# Patient Record
Sex: Male | Born: 2000 | Race: Black or African American | Hispanic: No | Marital: Single | Smoking: Never smoker
Health system: Southern US, Community
[De-identification: ages and names within clinical notes are randomized; demographics above are authoritative.]

## PROBLEM LIST (undated history)

## (undated) DIAGNOSIS — Z87898 Personal history of other specified conditions: Secondary | ICD-10-CM

## (undated) DIAGNOSIS — S83529A Sprain of posterior cruciate ligament of unspecified knee, initial encounter: Secondary | ICD-10-CM

---
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
## 2021-01-20 DIAGNOSIS — Z00129 Encounter for routine child health examination without abnormal findings: Secondary | ICD-10-CM | POA: Insufficient documentation

## 2021-01-20 DIAGNOSIS — Z0000 Encounter for general adult medical examination without abnormal findings: Secondary | ICD-10-CM | POA: Diagnosis not present

## 2021-10-18 ENCOUNTER — Ambulatory Visit (HOSPITAL_COMMUNITY)
Admission: EM | Admit: 2021-10-18 | Discharge: 2021-10-18 | Disposition: A | Discharge status: Home / Self Care | Payer: Medicaid Other | Attending: Nurse Practitioner | Admitting: Nurse Practitioner

## 2021-10-18 ENCOUNTER — Other Ambulatory Visit: Payer: Self-pay

## 2021-10-18 ENCOUNTER — Encounter (HOSPITAL_COMMUNITY): Payer: Self-pay

## 2021-10-18 DIAGNOSIS — J101 Influenza due to other identified influenza virus with other respiratory manifestations: Secondary | ICD-10-CM | POA: Diagnosis not present

## 2021-10-18 DIAGNOSIS — J111 Influenza due to unidentified influenza virus with other respiratory manifestations: Secondary | ICD-10-CM

## 2021-10-18 DIAGNOSIS — R112 Nausea with vomiting, unspecified: Secondary | ICD-10-CM

## 2021-10-18 LAB — POC INFLUENZA A AND B ANTIGEN (URGENT CARE ONLY)
INFLUENZA A ANTIGEN, POC: POSITIVE — AB
INFLUENZA B ANTIGEN, POC: NEGATIVE

## 2021-10-18 MED ORDER — ONDANSETRON 8 MG PO TBDP: 8.0000 mg | ORAL_TABLET | Freq: Three times a day (TID) | ORAL | 0 refills | Status: DC | PRN

## 2021-10-18 MED ORDER — OSELTAMIVIR PHOSPHATE 75 MG PO CAPS: 75.0000 mg | ORAL_CAPSULE | Freq: Two times a day (BID) | ORAL | 0 refills | Status: DC

## 2021-10-18 NOTE — ED Triage Notes (Signed)
Pt presents with cough, vomiting, diarrhea, and generalized body aches X 3 days.

## 2021-10-18 NOTE — ED Provider Notes (Signed)
[facility]    CSN: [id number] Arrival date & time: 10/18/21  1532      History   Chief Complaint Chief Complaint  Patient presents with   Influenza    HPI Griffon Herberg is a 20 y.o. male.   Subjective:   Thong Feeny is a 20 y.o. male who presents for evaluation of influenza like symptoms. Symptoms include headache, myalgias, nausea, vomiting, diarrhea, runny nose, congestion, sore throat (d/t cough) and right ear pain (mild). Symptoms have been present for 3 days. He has tried to alleviate the symptoms with rest, dayquil and alka seltzer with minimal relief. He is eating and drinking normally. He denies any sick contacts. He has not had the flu or COVID vaccine. High risk factors for influenza complications: none.  The following portions of the patient's history were reviewed and updated as appropriate: allergies, current medications, past family history, past medical history, past social history, past surgical history, and problem list.     Today's evaluation has revealed no signs of a dangerous process. Discussed diagnosis with patient and/or guardian. Patient and/or guardian aware of their diagnosis, possible red flag symptoms to watch out for and need for close follow up. Patient and/or guardian understands verbal and written discharge instructions. Patient and/or guardian comfortable with plan and disposition.  Patient and/or guardian has a clear mental status at this time, good insight into illness (after discussion and teaching) and has clear judgment to make decisions regarding their care  This care was provided during an unprecedented National Emergency due to the Novel Coronavirus (COVID-19) pandemic. COVID-19 infections and transmission risks place heavy strains on healthcare resources.  As this pandemic evolves, our facility, providers, and staff strive to respond fluidly, to remain operational, and to provide care relative to available resources and  information. Outcomes are unpredictable and treatments are without well-defined guidelines. Further, the impact of COVID-19 on all aspects of urgent care, including the impact to patients seeking care for reasons other than COVID-19, is unavoidable during this national emergency. At this time of the global pandemic, management of patients has significantly changed, even for non-COVID positive patients given high local and regional COVID volumes at this time requiring high healthcare system and resource utilization. The standard of care for management of both COVID suspected and non-COVID suspected patients continues to change rapidly at the local, regional, national, and global levels. This patient was worked up and treated to the best available but ever changing evidence and resources available at this current time.   Documentation was completed with the aid of voice recognition software. Transcription may contain typographical errors. Past Medical History:  Diagnosis Date   History of febrile seizure    age 7   Tear of posterior cruciate ligament of knee 01/2015   left    Patient Active Problem List   Diagnosis Date Noted   Difficulty swallowing    Anxiety disorder     Past Surgical History:  Procedure Laterality Date   KNEE ARTHROSCOPY WITH EXCISION PLICA Left 02/01/2015   Procedure: KNEE ARTHROSCOPY WITH EXCISION PLICA  WITH DEBRIDEMENT;  Surgeon: Sheral Apley, MD;  Location: [facility];  Service: Orthopedics;  Laterality: Left;       Home Medications    Prior to Admission medications   Medication Sig Start Date End Date Taking? Authorizing Provider  ondansetron (ZOFRAN-ODT) 8 MG disintegrating tablet Take 1 tablet (8 mg total) by mouth every 8 (eight) hours as needed for nausea or vomiting. 10/18/21  Yes Enrique Sack, FNP  oseltamivir (TAMIFLU) 75 MG capsule Take 1 capsule (75 mg total) by mouth every 12 (twelve) hours. 10/18/21  Yes Enrique Sack, FNP   ibuprofen (ADVIL,MOTRIN) 200 MG tablet Take 200 mg by mouth every 6 (six) hours as needed for moderate pain.    [provider]    Family History History reviewed. No pertinent family history.  Social History Social History   Tobacco Use   Smoking status: Never   Smokeless tobacco: Never  Substance Use Topics   Alcohol use: No   Drug use: No     Allergies   Patient has no known allergies.   Review of Systems Review of Systems   Physical Exam Triage Vital Signs ED Triage Vitals  Enc Vitals Group     BP 10/18/21 1815 (!) 146/56     Pulse Rate 10/18/21 1815 67     Resp 10/18/21 1815 18     Temp 10/18/21 1815 98 F (36.7 C)     Temp Source 10/18/21 1815 Oral     SpO2 10/18/21 1815 94 %     Weight --      Height --      Head Circumference --      Peak Flow --      Pain Score 10/18/21 1813 5     Pain Loc --      Pain Edu? --      Excl. in [place]? --    No data found.  Updated Vital Signs BP (!) 146/56 (BP Location: Right Arm)   Pulse 67   Temp 98 F (36.7 C) (Oral)   Resp 18   SpO2 94%   Visual Acuity Right Eye Distance:   Left Eye Distance:   Bilateral Distance:    Right Eye Near:   Left Eye Near:    Bilateral Near:     Physical Exam   UC Treatments / Results  Labs (all labs ordered are listed, but only abnormal results are displayed) Labs Reviewed  POC INFLUENZA A AND B ANTIGEN (URGENT CARE ONLY) - Abnormal; Notable for the following components:      Result Value   INFLUENZA A ANTIGEN, POC POSITIVE (*)    All other components within normal limits    EKG   Radiology No results found.  Procedures Procedures (including critical care time)  Medications Ordered in UC Medications - No data to display  Initial Impression / Assessment and Plan / UC Course  I have reviewed the triage vital signs and the nursing notes.  Pertinent labs & imaging results that were available during my care of the patient were reviewed by me and  considered in my medical decision making (see chart for details).     20 yo male presenting with a three-day history of eadache, myalgias, nausea, vomiting, diarrhea, runny nose, congestion, sore throat and right ear pain. Patient is afebrile and nontoxic appearing. Flu A positive.   Plan:  Supportive care with appropriate antipyretics and fluids. Antivirals per orders.  Today's evaluation has revealed no signs of a dangerous process. Discussed diagnosis with patient and/or guardian. Patient and/or guardian aware of their diagnosis, possible red flag symptoms to watch out for and need for close follow up. Patient and/or guardian understands verbal and written discharge instructions. Patient and/or guardian comfortable with plan and disposition.  Patient and/or guardian has a clear mental status at this time, good insight into illness (after discussion and teaching) and has clear judgment to make  decisions regarding their care  This care was provided during an unprecedented National Emergency due to the Novel Coronavirus (COVID-19) pandemic. COVID-19 infections and transmission risks place heavy strains on healthcare resources.  As this pandemic evolves, our facility, providers, and staff strive to respond fluidly, to remain operational, and to provide care relative to available resources and information. Outcomes are unpredictable and treatments are without well-defined guidelines. Further, the impact of COVID-19 on all aspects of urgent care, including the impact to patients seeking care for reasons other than COVID-19, is unavoidable during this national emergency. At this time of the global pandemic, management of patients has significantly changed, even for non-COVID positive patients given high local and regional COVID volumes at this time requiring high healthcare system and resource utilization. The standard of care for management of both COVID suspected and non-COVID suspected patients continues  to change rapidly at the local, regional, national, and global levels. This patient was worked up and treated to the best available but ever changing evidence and resources available at this current time.   Documentation was completed with the aid of voice recognition software. Transcription may contain typographical errors. Final Clinical Impressions(s) / UC Diagnoses   Final diagnoses:  Influenza  Nausea and vomiting, unspecified vomiting type     Discharge Instructions      You have influenza A. Influenza (flu) is a viral infection that mainly affects the respiratory tract. This includes the lungs, nose, and throat. The flu spreads easily from person to person. Antibiotic medicines are not prescribed for viral infections.This is because antibiotics are designed to kill bacteria. They do not kill viruses. Symptoms of the flu usually begin suddenly and can last 4-14 days. Take medications as prescribed. Drink plenty of fluids and get lots of rest. Do not leave home until you do not have a fever for 24 hours without taking fever reducing medicines like tylenol or ibuprofen.   Go to the ED immediately if you get worse or have any other symptoms.     Feel better soon!   Aldona Bar, FNP-C      ED Prescriptions     Medication Sig Dispense Auth. Provider   oseltamivir (TAMIFLU) 75 MG capsule Take 1 capsule (75 mg total) by mouth every 12 (twelve) hours. 10 capsule Enrique Sack, FNP   ondansetron (ZOFRAN-ODT) 8 MG disintegrating tablet Take 1 tablet (8 mg total) by mouth every 8 (eight) hours as needed for nausea or vomiting. 20 tablet Enrique Sack, FNP      PDMP not reviewed this encounter.   Enrique Sack, [place] 10/18/21 [phone]

## 2021-10-18 NOTE — Discharge Instructions (Addendum)
You have influenza A. Influenza (flu) is a viral infection that mainly affects the respiratory tract. This includes the lungs, nose, and throat. The flu spreads easily from person to person. Antibiotic medicines are not prescribed for viral infections.This is because antibiotics are designed to kill bacteria. They do not kill viruses. Symptoms of the flu usually begin suddenly and can last 4-14 days. Take medications as prescribed. Drink plenty of fluids and get lots of rest. Do not leave home until you do not have a fever for 24 hours without taking fever reducing medicines like tylenol or ibuprofen.   Go to the ED immediately if you get worse or have any other symptoms.     Feel better soon!   Lelon Mast, FNP-C

## 2021-12-12 ENCOUNTER — Ambulatory Visit (HOSPITAL_COMMUNITY)
Admission: EM | Admit: 2021-12-12 | Discharge: 2021-12-12 | Disposition: A | Discharge status: Home / Self Care | Payer: Medicaid Other | Attending: Family Medicine | Admitting: Family Medicine

## 2021-12-12 ENCOUNTER — Other Ambulatory Visit: Payer: Self-pay

## 2021-12-12 ENCOUNTER — Encounter (HOSPITAL_COMMUNITY): Payer: Self-pay | Admitting: Emergency Medicine

## 2021-12-12 DIAGNOSIS — R1013 Epigastric pain: Secondary | ICD-10-CM

## 2021-12-12 DIAGNOSIS — K625 Hemorrhage of anus and rectum: Secondary | ICD-10-CM | POA: Diagnosis not present

## 2021-12-12 MED ORDER — ONDANSETRON 4 MG PO TBDP: 4.0000 mg | ORAL_TABLET | Freq: Three times a day (TID) | ORAL | 0 refills | Status: DC | PRN

## 2021-12-12 MED ORDER — OMEPRAZOLE 40 MG PO CPDR: 40.0000 mg | DELAYED_RELEASE_CAPSULE | Freq: Every day | ORAL | 0 refills | Status: DC

## 2021-12-12 NOTE — ED Triage Notes (Signed)
Pt reports abd pain n/v/d since Sunday.

## 2021-12-12 NOTE — Discharge Instructions (Signed)
Ondansetron dissolved in the mouth every 8 hours as needed for nausea or vomiting. Clear liquids and bland things to eat.   Take omeprazole 40 mg 1 daily for stomach acid.  You could also take over-the-counter antacids like Tums or Mylanta as needed for your stomach pain

## 2021-12-12 NOTE — ED Provider Notes (Signed)
[place]    CSN: [id number] Arrival date & time: 12/12/21  1553      History   Chief Complaint Chief Complaint  Patient presents with   Abdominal Pain   Emesis   Diarrhea    HPI Gregory Burnett is a 21 y.o. male.    Abdominal Pain Associated symptoms: diarrhea and vomiting   Emesis Associated symptoms: abdominal pain and diarrhea   Diarrhea Associated symptoms: abdominal pain and vomiting   Here with epigastric pain, nausea and vomiting, and blood in the stool.  Symptoms began 2 days ago.  Today the epigastric pain has lessened; at first it was rated at 6-8 out of 10 but now it is more like a 4.  He has not thrown up today and is actually managed to eat some [employer] and drink some Gatorade.  No fever or chills.  No upper respiratory symptoms.  He did see bright red blood in the stool 2 days ago and then yesterday his stool was very dark.  No dizziness  Past medical history is significant for a duodenitis about 3 years ago.  A note is reviewed from a pediatric gastroenterologist who he saw at that time.  He was given a PPI, but the patient only took it a few days and otherwise he remained improved this recently  Past Medical History:  Diagnosis Date   History of febrile seizure    age 57   Tear of posterior cruciate ligament of knee 01/2015   left    Patient Active Problem List   Diagnosis Date Noted   Difficulty swallowing    Anxiety disorder     Past Surgical History:  Procedure Laterality Date   KNEE ARTHROSCOPY WITH EXCISION PLICA Left [id number]   Procedure: KNEE ARTHROSCOPY WITH EXCISION PLICA  WITH DEBRIDEMENT;  Surgeon: Renette Butters, MD;  Location: [facility];  Service: Orthopedics;  Laterality: Left;       Home Medications    Prior to Admission medications   Medication Sig Start Date End Date Taking? Authorizing Provider  omeprazole (PRILOSEC) 40 MG capsule Take 1 capsule (40 mg total) by mouth daily. 12/12/21  Yes  Barrett Henle, MD  ondansetron (ZOFRAN-ODT) 4 MG disintegrating tablet Take 1 tablet (4 mg total) by mouth every 8 (eight) hours as needed for nausea or vomiting. 12/12/21  Yes Windy Carina Gwenlyn Perking, MD    Family History No family history on file.  Social History Social History   Tobacco Use   Smoking status: Never   Smokeless tobacco: Never  Substance Use Topics   Alcohol use: No   Drug use: No     Allergies   Patient has no known allergies.   Review of Systems Review of Systems  Gastrointestinal:  Positive for abdominal pain, diarrhea and vomiting.    Physical Exam Triage Vital Signs ED Triage Vitals [12/12/21 1737]  Enc Vitals Group     BP 119/72     Pulse Rate (!) 58     Resp 15     Temp 98.1 F (36.7 C)     Temp Source Oral     SpO2 99 %     Weight      Height      Head Circumference      Peak Flow      Pain Score 4     Pain Loc      Pain Edu?      Excl. in [place]?  No data found.  Updated Vital Signs BP 119/72 (BP Location: Right Arm)    Pulse (!) 58    Temp 98.1 F (36.7 C) (Oral)    Resp 15    SpO2 99%   Visual Acuity Right Eye Distance:   Left Eye Distance:   Bilateral Distance:    Right Eye Near:   Left Eye Near:    Bilateral Near:     Physical Exam Vitals reviewed.  Constitutional:      General: He is not in acute distress.    Appearance: He is not ill-appearing, toxic-appearing or diaphoretic.  HENT:     Mouth/Throat:     Mouth: Mucous membranes are moist.     Comments: MM moist and pink Eyes:     Extraocular Movements: Extraocular movements intact.     Pupils: Pupils are equal, round, and reactive to light.  Cardiovascular:     Rate and Rhythm: Normal rate and regular rhythm.     Heart sounds: No murmur heard. Pulmonary:     Effort: Pulmonary effort is normal.     Breath sounds: Normal breath sounds. No wheezing.  Abdominal:     General: Bowel sounds are normal. There is no distension.     Palpations: Abdomen is soft.  There is no mass.     Tenderness: There is abdominal tenderness (epigastric area, mild). There is no guarding.  Musculoskeletal:     Cervical back: Neck supple.  Lymphadenopathy:     Cervical: No cervical adenopathy.  Skin:    Capillary Refill: Capillary refill takes less than 2 seconds.     Coloration: Skin is not jaundiced or pale.  Neurological:     General: No focal deficit present.     Mental Status: He is alert and oriented to person, place, and time.  Psychiatric:        Behavior: Behavior normal.     UC Treatments / Results  Labs (all labs ordered are listed, but only abnormal results are displayed) Labs Reviewed - No data to display  EKG   Radiology No results found.  Procedures Procedures (including critical care time)  Medications Ordered in UC Medications - No data to display  Initial Impression / Assessment and Plan / UC Course  I have reviewed the triage vital signs and the nursing notes.  Pertinent labs & imaging results that were available during my care of the patient were reviewed by me and considered in my medical decision making (see chart for details).     We will treat with a PPI and Zofran.  Contact information for gastroenterology and potential primary care is given today.  Warnings given to go to the ER if he has brisk rectal bleeding or his abdominal pain worsens a lot Final Clinical Impressions(s) / UC Diagnoses   Final diagnoses:  Abdominal pain, epigastric  Rectal bleeding     Discharge Instructions      Ondansetron dissolved in the mouth every 8 hours as needed for nausea or vomiting. Clear liquids and bland things to eat.   Take omeprazole 40 mg 1 daily for stomach acid.  You could also take over-the-counter antacids like Tums or Mylanta as needed for your stomach pain     ED Prescriptions     Medication Sig Dispense Auth. Provider   omeprazole (PRILOSEC) 40 MG capsule Take 1 capsule (40 mg total) by mouth daily. 30  capsule Barrett Henle, MD   ondansetron (ZOFRAN-ODT) 4 MG disintegrating tablet Take 1 tablet (  4 mg total) by mouth every 8 (eight) hours as needed for nausea or vomiting. 10 tablet Windy Carina Gwenlyn Perking, MD      PDMP not reviewed this encounter.   Barrett Henle, MD 12/12/21 [facility]

## 2022-10-29 DIAGNOSIS — H5213 Myopia, bilateral: Secondary | ICD-10-CM | POA: Diagnosis not present

## 2022-11-26 DIAGNOSIS — H52223 Regular astigmatism, bilateral: Secondary | ICD-10-CM | POA: Diagnosis not present

## 2022-11-26 DIAGNOSIS — H5213 Myopia, bilateral: Secondary | ICD-10-CM | POA: Diagnosis not present

## 2023-06-26 ENCOUNTER — Ambulatory Visit (HOSPITAL_BASED_OUTPATIENT_CLINIC_OR_DEPARTMENT_OTHER): Payer: Medicaid Other | Admitting: Family Medicine

## 2023-06-26 NOTE — Progress Notes (Deleted)
   New Patient Office Visit  Subjective    Patient ID: Gregory Burnett, male    DOB: 12-Feb-2001  Age: 22 y.o. MRN: [id number]  CC: No chief complaint on file.   HPI Gregory Burnett is a 22 year-old male who presents to establish care. Past medical history-  Medications-    Outpatient Encounter Medications as of 06/26/2023  Medication Sig   omeprazole (PRILOSEC) 40 MG capsule Take 1 capsule (40 mg total) by mouth daily.   ondansetron (ZOFRAN-ODT) 4 MG disintegrating tablet Take 1 tablet (4 mg total) by mouth every 8 (eight) hours as needed for nausea or vomiting.   No facility-administered encounter medications on file as of 06/26/2023.    Past Medical History:  Diagnosis Date   History of febrile seizure    age 22   Tear of posterior cruciate ligament of knee 01/2015   left    Past Surgical History:  Procedure Laterality Date   KNEE ARTHROSCOPY WITH EXCISION PLICA Left 02/01/2015   Procedure: KNEE ARTHROSCOPY WITH EXCISION PLICA  WITH DEBRIDEMENT;  Surgeon: Sheral Apley, MD;  Location: [facility];  Service: Orthopedics;  Laterality: Left;    No family history on file.   ROS      Objective    There were no vitals taken for this visit.  Physical Exam      Assessment & Plan:   ***  No follow-ups on file.   Alyson Reedy, FNP

## 2023-06-28 ENCOUNTER — Ambulatory Visit (HOSPITAL_BASED_OUTPATIENT_CLINIC_OR_DEPARTMENT_OTHER): Payer: Medicaid Other | Admitting: Family Medicine

## 2024-05-18 ENCOUNTER — Ambulatory Visit: Admitting: Family Medicine

## 2024-07-06 ENCOUNTER — Ambulatory Visit
Admission: EM | Admit: 2024-07-06 | Discharge: 2024-07-06 | Disposition: A | Discharge status: Home / Self Care | Attending: Emergency Medicine | Admitting: Emergency Medicine

## 2024-07-06 DIAGNOSIS — H109 Unspecified conjunctivitis: Secondary | ICD-10-CM | POA: Diagnosis not present

## 2024-07-06 MED ORDER — POLYMYXIN B-TRIMETHOPRIM 10000-0.1 UNIT/ML-% OP SOLN: 2.0000 [drp] | Freq: Four times a day (QID) | OPHTHALMIC | 0 refills | Status: AC

## 2024-07-06 NOTE — ED Provider Notes (Signed)
 [facility]    CSN: [id number] Arrival date & time: 07/06/24  1224      History   Chief Complaint Chief Complaint  Patient presents with   Eye Problem    HPI Gregory Burnett is a 23 y.o. male.  Patient without significant medical history presents to urgent care today with concerns of eye irritation.  Reports that he noticed eye rotation develop yesterday after being exposed to his daughter and mother-in-law who have had similar irritation to the eyes.  He endorsed that he woke up this morning with a purulent drainage from the left eye.  Endorses some irritation but denies any significant pain.  Denies any itching of the eye.  Patient does not wear contact lenses.   Eye Problem Associated symptoms: redness     Past Medical History:  Diagnosis Date   History of febrile seizure    age 51   Tear of posterior cruciate ligament of knee 01/2015   left    Patient Active Problem List   Diagnosis Date Noted   Anxiety disorder 11/21/2018   Difficulty swallowing     Past Surgical History:  Procedure Laterality Date   KNEE ARTHROSCOPY WITH EXCISION PLICA Left 02/01/2015   Procedure: KNEE ARTHROSCOPY WITH EXCISION PLICA  WITH DEBRIDEMENT;  Surgeon: Evalene JONETTA Chancy, MD;  Location: [facility];  Service: Orthopedics;  Laterality: Left;       Home Medications    Prior to Admission medications   Medication Sig Start Date End Date Taking? Authorizing Provider  trimethoprim -polymyxin b  (POLYTRIM ) ophthalmic solution Place 2 drops into the left eye every 6 (six) hours for 7 days. 07/06/24 07/13/24 Yes Nishant Schrecengost A, PA-C  omeprazole  (PRILOSEC) 40 MG capsule Take 1 capsule (40 mg total) by mouth daily. 12/12/21   Vonna Sharlet POUR, MD  ondansetron  (ZOFRAN -ODT) 4 MG disintegrating tablet Take 1 tablet (4 mg total) by mouth every 8 (eight) hours as needed for nausea or vomiting. 12/12/21   Vonna Sharlet POUR, MD    Family History History reviewed. No pertinent  family history.  Social History Social History   Tobacco Use   Smoking status: Never    Passive exposure: Never   Smokeless tobacco: Never  Vaping Use   Vaping status: Every Day   Substances: Nicotine, Flavoring  Substance Use Topics   Alcohol use: No   Drug use: No     Allergies   Patient has no known allergies.   Review of Systems Review of Systems  Eyes:  Positive for pain and redness.  All other systems reviewed and are negative.    Physical Exam Triage Vital Signs ED Triage Vitals  Encounter Vitals Group     BP 07/06/24 1304 125/78     Girls Systolic BP Percentile --      Girls Diastolic BP Percentile --      Boys Systolic BP Percentile --      Boys Diastolic BP Percentile --      Pulse Rate 07/06/24 1304 76     Resp 07/06/24 1304 18     Temp 07/06/24 1304 98 F (36.7 C)     Temp Source 07/06/24 1304 Oral     SpO2 07/06/24 1304 96 %     Weight 07/06/24 1301 273 lb (123.8 kg)     Height 07/06/24 1301 6' 6 (1.981 m)     Head Circumference --      Peak Flow --      Pain Score 07/06/24  1257 1     Pain Loc --      Pain Education --      Exclude from Growth Chart --    No data found.  Updated Vital Signs BP 125/78 (BP Location: Left Arm)   Pulse 76   Temp 98 F (36.7 C) (Oral)   Resp 18   Ht 6' 6 (1.981 m)   Wt 273 lb (123.8 kg)   SpO2 96%   BMI 31.55 kg/m   Visual Acuity Right Eye Distance: (S) 20/20 (Corrected) Left Eye Distance: (S) 20/20 (Corrected) Bilateral Distance: (S) 20/20 (Corrected)  Right Eye Near:   Left Eye Near:    Bilateral Near:     Physical Exam Vitals and nursing note reviewed.  Constitutional:      General: He is not in acute distress.    Appearance: He is well-developed.  HENT:     Head: Normocephalic and atraumatic.  Eyes:     General:        Right eye: No discharge.        Left eye: Discharge present.    Extraocular Movements: Extraocular movements intact.     Pupils: Pupils are equal, round, and reactive  to light.     Comments: Left eye with conjunctival injection and purulent drainage present primarily towards the medial aspect of the eye.  No signs of any foreign body.  Musculoskeletal:        General: No swelling.     Cervical back: Neck supple.  Skin:    General: Skin is warm and dry.     Capillary Refill: Capillary refill takes less than 2 seconds.  Neurological:     Mental Status: He is alert.  Psychiatric:        Mood and Affect: Mood normal.      UC Treatments / Results  Labs (all labs ordered are listed, but only abnormal results are displayed) Labs Reviewed - No data to display  EKG   Radiology No results found.  Procedures Procedures (including critical care time)  Medications Ordered in UC Medications - No data to display  Initial Impression / Assessment and Plan / UC Course  I have reviewed the triage vital signs and the nursing notes.  Pertinent labs & imaging results that were available during my care of the patient were reviewed by me and considered in my medical decision making (see chart for details).  Patient presents the urgent care today with concerns of eye problem.  Reports multiple contacts with individuals with similar symptoms including his daughter and mother-in-law.  Reports that he woke up this morning with purulent drainage and crusting of the left eye.  Endorses redness and some irritation to the eye that he describes as slight pain but denies any itching.  No reports systemic symptoms such as fever, chills, body aches.  Sickle exam reveals conjunctival injection to the left eye.  No other signs of any corneal irritation or abrasion.  No signs of any foreign bodies.  There are some mucopurulent drainage primarily to the medial aspect of the left eye.  Based on exam, appears the patient likely has bacterial conjunctivitis that has been spreading from family members.  Will start patient on antibiotic drops for the neck 7 days.  Advise return  precautions for in urgent care as well as ED precautions for worsening symptoms.  Patient otherwise stable for outpatient follow-up and discharge home. Final Clinical Impressions(s) / UC Diagnoses   Final diagnoses:  Bacterial conjunctivitis  of left eye     Discharge Instructions      You are seen at the urgent care today for concerns of eye irritation.  You appear to have bacterial conjunctivitis, or pinkeye.  This is likely what has been spreading between your team including her daughter and mother-in-law.  Try to prevent coming into contact with your left eye and then touching other surfaces.  Keep your hands clean.  For any concerns of new or worsening symptoms, return to the urgent care or seek emergency evaluation.     ED Prescriptions     Medication Sig Dispense Auth. Provider   trimethoprim -polymyxin b  (POLYTRIM ) ophthalmic solution Place 2 drops into the left eye every 6 (six) hours for 7 days. 10 mL Keyoni Lapinski A, PA-C      PDMP not reviewed this encounter.   Aubrynn Katona A, PA-C 07/06/24 1324

## 2024-07-06 NOTE — ED Triage Notes (Signed)
 This started after being exposed to my girlfriends mother show had an eye infection she contracted at the hospital, my exposure was the weekend. Now with some watery eye (left eye) with redness/swelling. No discharge maybe pain when I blink.

## 2024-07-06 NOTE — Discharge Instructions (Addendum)
 You are seen at the urgent care today for concerns of eye irritation.  You appear to have bacterial conjunctivitis, or pinkeye.  This is likely what has been spreading between your team including her daughter and mother-in-law.  Try to prevent coming into contact with your left eye and then touching other surfaces.  Keep your hands clean.  For any concerns of new or worsening symptoms, return to the urgent care or seek emergency evaluation.

## 2024-07-12 ENCOUNTER — Emergency Department (HOSPITAL_COMMUNITY)
Admission: EM | Admit: 2024-07-12 | Discharge: 2024-07-12 | Disposition: A | Discharge status: Home / Self Care | Attending: Emergency Medicine | Admitting: Emergency Medicine

## 2024-07-12 ENCOUNTER — Encounter (HOSPITAL_COMMUNITY): Payer: Self-pay

## 2024-07-12 ENCOUNTER — Other Ambulatory Visit: Payer: Self-pay

## 2024-07-12 DIAGNOSIS — H1089 Other conjunctivitis: Secondary | ICD-10-CM | POA: Insufficient documentation

## 2024-07-12 DIAGNOSIS — H109 Unspecified conjunctivitis: Secondary | ICD-10-CM

## 2024-07-12 DIAGNOSIS — H5789 Other specified disorders of eye and adnexa: Secondary | ICD-10-CM | POA: Diagnosis present

## 2024-07-12 MED ORDER — SYSTANE ULTRA 0.4-0.3 % OP SOLN: 2.0000 [drp] | Freq: Every day | OPHTHALMIC | 0 refills | Status: DC | PRN

## 2024-07-12 MED ORDER — MOXIFLOXACIN HCL 0.5 % OP SOLN: 1.0000 [drp] | Freq: Three times a day (TID) | OPHTHALMIC | 0 refills | Status: AC

## 2024-07-12 NOTE — ED Triage Notes (Signed)
 Patient here with complaints of a possible eye infection. Left eye is swollen. Patient states he has cold symptoms, watery eye, sometimes burns and has pain when he moves it around.

## 2024-07-12 NOTE — Discharge Instructions (Addendum)
 You are seen today for bacterial conjunctivitis that is refractory to Polytrim .  I am prescribing you an antibiotic for you to use in place of the Polytrim .  You are to use the moxifloxacin  eyedrops as well as using the lubricating eyedrops for help with the dry sensation.  Please return to follow-up with ophthalmology if you still have symptoms after taking this for a few days without any improvement.  Return to ED though if you need to have any new or worsening symptoms which include fever, inability or increased pain when moving your eye, protrusion of eye

## 2024-07-12 NOTE — ED Notes (Signed)
 Patient stated understanding of d/c instructions. In stable condition and ambulatory from ED with no pain and steady gait.

## 2024-07-12 NOTE — ED Provider Notes (Signed)
 [facility] Provider Note   CSN: [id number] Arrival date & time: 07/12/24  1346     Patient presents with: No chief complaint on file.   Gregory Burnett is a 23 y.o. male.  HPI Patient is a 23 year old male presents the ED today for concerns for left-sided bacterial conjunctivitis.  Taking Polytrim  for the last 7 days after being diagnosed with bacterial conjunctivitis at urgent care.  Noted that he had had a daughter and family member who also had similar symptoms.  Noting no improvement in the symptoms with using the eyedrops, however has not had any worsening symptoms.  Denies fever, headache, decreased EOM, blurry vision, diplopia, worsening pain, dysphagia, shortness of breath.    Prior to Admission medications   Medication Sig Start Date End Date Taking? Authorizing Provider  moxifloxacin  (VIGAMOX ) 0.5 % ophthalmic solution Place 1 drop into the left eye 3 (three) times daily for 7 days. 07/12/24 07/19/24 Yes Makayla Confer S, PA-C  Polyethyl Glycol-Propyl Glycol (SYSTANE ULTRA) 0.4-0.3 % SOLN Apply 2 drops to eye daily as needed. 07/12/24  Yes Beola Terrall RAMAN, PA-C  omeprazole  (PRILOSEC) 40 MG capsule Take 1 capsule (40 mg total) by mouth daily. 12/12/21   Vonna Sharlet POUR, MD  ondansetron  (ZOFRAN -ODT) 4 MG disintegrating tablet Take 1 tablet (4 mg total) by mouth every 8 (eight) hours as needed for nausea or vomiting. 12/12/21   Banister, Pamela K, MD  trimethoprim -polymyxin b  (POLYTRIM ) ophthalmic solution Place 2 drops into the left eye every 6 (six) hours for 7 days. 07/06/24 07/13/24  Zelaya, Oscar A, PA-C    Allergies: Patient has no known allergies.    Review of Systems  Eyes:  Positive for redness.  All other systems reviewed and are negative.   Updated Vital Signs BP 138/62   Pulse 68   Temp 98.4 F (36.9 C) (Oral)   Resp 16   Ht 6' 6 (1.981 m)   Wt 124.7 kg   SpO2 98%   BMI 31.78 kg/m   Physical Exam Vitals and  nursing note reviewed.  Constitutional:      General: He is not in acute distress.    Appearance: Normal appearance. He is not ill-appearing or diaphoretic.  HENT:     Head: Normocephalic and atraumatic.  Eyes:     General: Lids are normal. Vision grossly intact. Gaze aligned appropriately. No scleral icterus.       Right eye: No discharge.        Left eye: Discharge present.    Extraocular Movements: Extraocular movements intact.     Right eye: Normal extraocular motion and no nystagmus.     Left eye: Normal extraocular motion and no nystagmus.     Conjunctiva/sclera:     Right eye: Right conjunctiva is not injected. No chemosis or exudate.    Left eye: Left conjunctiva is injected. No chemosis or exudate.    Pupils: Pupils are equal, round, and reactive to light. Pupils are equal.     Right eye: Pupil is round, reactive and not sluggish.     Left eye: Pupil is round, reactive and not sluggish.  Cardiovascular:     Rate and Rhythm: Normal rate and regular rhythm.     Pulses: Normal pulses.     Heart sounds: Normal heart sounds. No murmur heard.    No friction rub. No gallop.  Pulmonary:     Effort: Pulmonary effort is normal. No respiratory distress.  Breath sounds: No stridor. No wheezing, rhonchi or rales.  Chest:     Chest wall: No tenderness.  Abdominal:     General: Abdomen is flat. There is no distension.     Palpations: Abdomen is soft.     Tenderness: There is no abdominal tenderness. There is no right CVA tenderness, left CVA tenderness, guarding or rebound.  Musculoskeletal:        General: No swelling, deformity or signs of injury.     Cervical back: Normal range of motion. No rigidity.     Right lower leg: No edema.     Left lower leg: No edema.  Skin:    General: Skin is warm and dry.     Findings: No bruising, erythema or lesion.  Neurological:     General: No focal deficit present.     Mental Status: He is alert and oriented to person, place, and time.  Mental status is at baseline.     Sensory: No sensory deficit.     Motor: No weakness.  Psychiatric:        Mood and Affect: Mood normal.     (all labs ordered are listed, but only abnormal results are displayed) Labs Reviewed - No data to display  EKG: None  Radiology: No results found.  Procedures   Medications Ordered in the ED - No data to display                               Medical Decision Making  This patient is a 23 year old male who presents to the ED for concern of left sided bacterial conjunctivitis, noted to been present for the last 8 days, treated a week ago with Polytrim  by urgent care with no relief.  Has not  endorse any worsening symptoms.  Noting that his daughter and other family were had had similar symptoms.  Is not having any blurry vision, diplopia, pain with EOMs.  Only saying his eye is still scratchy in his left eye.  On physical exam, patient is in no acute distress, afebrile, alert and orient x 4, speaking in full sentences, nontachypneic, nontachycardic.  Left eye is notably injected.  All visual fields intact, no chemosis, no pain with EOMs.  EOMs are intact.  No proptosis.  PERRL.  Low suspicion for corneal abrasion, traumatic iritis, periorbital cellulitis, orbital cellulitis.  Will switch to moxifloxacin  with Polytrim  not resolving symptoms as well as have him continue to follow-up with ophthalmology if symptoms persist.  Will also provide lubricating eyedrops for the scratchy sensation he feels in his left eye.  Patient vital signs have remained stable throughout the course of patient's time in the ED. Low suspicion for any other emergent pathology at this time. I believe this patient is safe to be discharged. Provided strict return to ER precautions. Patient expressed agreement and understanding of plan. All questions were answered.  Differential diagnoses prior to evaluation: The emergent differential diagnosis includes, but is not limited  to, bacterial conjunctivitis, viral conjunctivitis,. This is not an exhaustive differential.   Past Medical History / Co-morbidities / Social History: No chronic past medical history  Additional history: Chart reviewed. Pertinent results include:   Seen on 07/06/2024 for bacterial conjunctivitis.  No testing eye irritation after being exposed to his daughter's mother-in-law who had similar symptoms.  Noting purulent drainage from particularly his left eye at that time.  Provided Polytrim  ophthalmic solution.  Medications: I ordered  medication including moxifloxacin , Systane ultra.  I have reviewed the patients home medicines and have made adjustments as needed.  Critical Interventions: None  Social Determinants of Health: None  Disposition: After consideration of the diagnostic results and the patients response to treatment, I feel that the patient would benefit from discharge and treatment as above.   emergency department workup does not suggest an emergent condition requiring admission or immediate intervention beyond what has been performed at this time. The plan is: Switch to moxifloxacin , Systane eyedrops for scratchy eyes, follow-up with ophthalmology, return for new or worsening symptoms. The patient is safe for discharge and has been instructed to return immediately for worsening symptoms, change in symptoms or any other concerns.   Final diagnoses:  Bacterial conjunctivitis of left eye    ED Discharge Orders          Ordered    moxifloxacin  (VIGAMOX ) 0.5 % ophthalmic solution  3 times daily        07/12/24 1603    Polyethyl Glycol-Propyl Glycol (SYSTANE ULTRA) 0.4-0.3 % SOLN  Daily PRN        07/12/24 1603               Beola Terrall RAMAN, [place] 07/12/24 1611    Dasie Faden, MD 07/14/24 [phone]

## 2024-10-02 ENCOUNTER — Ambulatory Visit
Admission: EM | Admit: 2024-10-02 | Discharge: 2024-10-02 | Disposition: A | Discharge status: Home / Self Care | Attending: Family Medicine | Admitting: Family Medicine

## 2024-10-02 ENCOUNTER — Encounter: Payer: Self-pay | Admitting: *Deleted

## 2024-10-02 ENCOUNTER — Other Ambulatory Visit: Payer: Self-pay

## 2024-10-02 DIAGNOSIS — J069 Acute upper respiratory infection, unspecified: Secondary | ICD-10-CM | POA: Diagnosis not present

## 2024-10-02 LAB — POC COVID19/FLU A&B COMBO
Covid Antigen, POC: NEGATIVE
Influenza A Antigen, POC: NEGATIVE
Influenza B Antigen, POC: NEGATIVE

## 2024-10-02 MED ORDER — IBUPROFEN 600 MG PO TABS: 600.0000 mg | ORAL_TABLET | Freq: Three times a day (TID) | ORAL | 0 refills | Status: AC | PRN

## 2024-10-02 MED ORDER — BENZONATATE 100 MG PO CAPS: 100.0000 mg | ORAL_CAPSULE | Freq: Three times a day (TID) | ORAL | 0 refills | Status: AC | PRN

## 2024-10-02 NOTE — ED Triage Notes (Signed)
 Pt reports cough, runny nose, congestion, sore throat x 2 days. Unknown if he has had a fever. He has been alka-seltzer plus with some relief, last dose yesterday. His daughter has been sick.

## 2024-10-02 NOTE — Discharge Instructions (Signed)
 The testing for flu and COVID is negative  Take benzonatate  100 mg, 1 tab every 8 hours as needed for cough.  Take ibuprofen  600 mg--1 tab every 8 hours as needed for pain.

## 2024-10-02 NOTE — ED Provider Notes (Signed)
 [facility]    CSN: [id number] Arrival date & time: 10/02/24  1540      History   Chief Complaint Chief Complaint  Patient presents with   Cough    HPI Gregory Burnett is a 23 y.o. male.    Cough  Here for cough and nasal congestion and rhinorrhea and sore throat.  Symptoms began on November 19.  He does not think he has had a fever.  He has been taking some Alka-Seltzer plus that has helped some, last dose yesterday.  He was exposed to his daughter who had similar symptoms.  She did not get any testing done  NKDA  Past Medical History:  Diagnosis Date   History of febrile seizure    age 75   Tear of posterior cruciate ligament of knee 01/2015   left    Patient Active Problem List   Diagnosis Date Noted   Well child check 01/20/2021   Anxiety disorder 11/21/2018   Difficulty swallowing     Past Surgical History:  Procedure Laterality Date   KNEE ARTHROSCOPY WITH EXCISION PLICA Left 02/01/2015   Procedure: KNEE ARTHROSCOPY WITH EXCISION PLICA  WITH DEBRIDEMENT;  Surgeon: Evalene JONETTA Chancy, MD;  Location: [facility];  Service: Orthopedics;  Laterality: Left;       Home Medications    Prior to Admission medications   Medication Sig Start Date End Date Taking? Authorizing Provider  benzonatate  (TESSALON ) 100 MG capsule Take 1 capsule (100 mg total) by mouth 3 (three) times daily as needed for cough. 10/02/24  Yes Vonna Sharlet POUR, MD  ibuprofen  (ADVIL ) 600 MG tablet Take 1 tablet (600 mg total) by mouth every 8 (eight) hours as needed (pain). 10/02/24  Yes Vonna Sharlet POUR, MD    Family History History reviewed. No pertinent family history.  Social History Social History   Tobacco Use   Smoking status: Never    Passive exposure: Never   Smokeless tobacco: Never  Vaping Use   Vaping status: Every Day   Substances: Nicotine, Flavoring  Substance Use Topics   Alcohol use: Yes    Comment: occasional beer   Drug use:  No     Allergies   Patient has no known allergies.   Review of Systems Review of Systems  Respiratory:  Positive for cough.      Physical Exam Triage Vital Signs ED Triage Vitals  Encounter Vitals Group     BP 10/02/24 1611 136/77     Girls Systolic BP Percentile --      Girls Diastolic BP Percentile --      Boys Systolic BP Percentile --      Boys Diastolic BP Percentile --      Pulse Rate 10/02/24 1611 83     Resp 10/02/24 1611 18     Temp 10/02/24 1611 98.7 F (37.1 C)     Temp Source 10/02/24 1611 Oral     SpO2 10/02/24 1611 95 %     Weight --      Height --      Head Circumference --      Peak Flow --      Pain Score 10/02/24 1609 0     Pain Loc --      Pain Education --      Exclude from Growth Chart --    No data found.  Updated Vital Signs BP 136/77 (BP Location: Right Arm)   Pulse 83   Temp 98.7 F (  37.1 C) (Oral)   Resp 18   SpO2 95%   Visual Acuity Right Eye Distance:   Left Eye Distance:   Bilateral Distance:    Right Eye Near:   Left Eye Near:    Bilateral Near:     Physical Exam Vitals reviewed.  Constitutional:      General: He is not in acute distress.    Appearance: He is not ill-appearing, toxic-appearing or diaphoretic.  HENT:     Right Ear: Tympanic membrane and ear canal normal.     Left Ear: Tympanic membrane and ear canal normal.     Nose: Congestion present.     Mouth/Throat:     Mouth: Mucous membranes are moist.     Pharynx: No oropharyngeal exudate or posterior oropharyngeal erythema.  Eyes:     Extraocular Movements: Extraocular movements intact.     Conjunctiva/sclera: Conjunctivae normal.     Pupils: Pupils are equal, round, and reactive to light.  Cardiovascular:     Rate and Rhythm: Normal rate and regular rhythm.     Heart sounds: No murmur heard. Pulmonary:     Effort: Pulmonary effort is normal. No respiratory distress.     Breath sounds: Normal breath sounds. No stridor. No wheezing, rhonchi or rales.   Musculoskeletal:     Cervical back: Neck supple.  Lymphadenopathy:     Cervical: No cervical adenopathy.  Skin:    Capillary Refill: Capillary refill takes less than 2 seconds.     Coloration: Skin is not jaundiced or pale.  Neurological:     General: No focal deficit present.     Mental Status: He is alert and oriented to person, place, and time.  Psychiatric:        Behavior: Behavior normal.      UC Treatments / Results  Labs (all labs ordered are listed, but only abnormal results are displayed) Labs Reviewed  POC COVID19/FLU A&B COMBO - Normal    EKG   Radiology No results found.  Procedures Procedures (including critical care time)  Medications Ordered in UC Medications - No data to display  Initial Impression / Assessment and Plan / UC Course  I have reviewed the triage vital signs and the nursing notes.  Pertinent labs & imaging results that were available during my care of the patient were reviewed by me and considered in my medical decision making (see chart for details).    {The patient has been seen in Urgent Care in the last 3 years. :Testing for COVID and flu is negative.  Tessalon  perles are sent in for cough and some ibuprofen  600 mg sent in for pain. Final Clinical Impressions(s) / UC Diagnoses   Final diagnoses:  Viral URI     Discharge Instructions      The testing for flu and COVID is negative  Take benzonatate  100 mg, 1 tab every 8 hours as needed for cough.  Take ibuprofen  600 mg--1 tab every 8 hours as needed for pain.      ED Prescriptions     Medication Sig Dispense Auth. Provider   benzonatate  (TESSALON ) 100 MG capsule Take 1 capsule (100 mg total) by mouth 3 (three) times daily as needed for cough. 21 capsule Vonna Sharlet POUR, MD   ibuprofen  (ADVIL ) 600 MG tablet Take 1 tablet (600 mg total) by mouth every 8 (eight) hours as needed (pain). 15 tablet Hillary Schwegler K, MD      PDMP not reviewed this encounter.    Eugina Row,  Sharlet POUR, MD 10/02/24 [phone]
